# Patient Record
Sex: Male | Born: 2005 | Race: Black or African American | Hispanic: No | Marital: Single | Smoking: Never smoker
Health system: Southern US, Community
[De-identification: ages and names within clinical notes are randomized; demographics above are authoritative.]

## PROBLEM LIST (undated history)

## (undated) HISTORY — PX: OTHER SURGICAL HISTORY: SHX169

---
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
## 2024-07-04 ENCOUNTER — Other Ambulatory Visit: Payer: Self-pay

## 2024-07-04 ENCOUNTER — Emergency Department
Admission: EM | Admit: 2024-07-04 | Discharge: 2024-07-04 | Disposition: A | Discharge status: Home / Self Care | Attending: Emergency Medicine | Admitting: Emergency Medicine

## 2024-07-04 DIAGNOSIS — M79675 Pain in left toe(s): Secondary | ICD-10-CM | POA: Insufficient documentation

## 2024-07-04 NOTE — ED Provider Notes (Signed)
 [facility] Provider Note    Event Date/Time   First MD Initiated Contact with Patient 07/04/24 1200     (approximate)   History   Toe Injury   HPI  Maxwell Simmons is a 18 y.o. male  with no pertinent past medical history presents to the emergency department with left pinky toe pain. Patient states this pain has been on and off for over a year specifically after he scratches his toe. Reports it hurts intermittently.  Has not tried any home remedies.  Denies fever, chills, pus-like discharge, excessive warmth, erythema, fall or injury, foot or ankle pain. Unspecified allergy to ibuprofen. Has not had a recent pedicure.   Physical Exam   Triage Vital Signs: ED Triage Vitals  Encounter Vitals Group     BP 07/04/24 1148 132/82     Girls Systolic BP Percentile --      Girls Diastolic BP Percentile --      Boys Systolic BP Percentile --      Boys Diastolic BP Percentile --      Pulse Rate 07/04/24 1148 84     Resp 07/04/24 1148 18     Temp 07/04/24 1148 98 F (36.7 C)     Temp src --      SpO2 07/04/24 1148 96 %     Weight --      Height --      Head Circumference --      Peak Flow --      Pain Score 07/04/24 1146 4     Pain Loc --      Pain Education --      Exclude from Growth Chart --     Most recent vital signs: Vitals:   07/04/24 1148  BP: 132/82  Pulse: 84  Resp: 18  Temp: 98 F (36.7 C)  SpO2: 96%    General: Awake, in no acute distress. Appears stated age. CV: Good peripheral perfusion. DP pulses 2+ b/l. Cap refill <2 sec. Respiratory:Normal respiratory effort.  No respiratory distress.  GI: Soft, non-distended. MSK: Normal ROM and 5/5 strength in b/l toes and ankles. Skin:Warm, dry, intact. Image below--appears part of his lateral 5th left toenail is missing with some overgrown skin. No fluctuant area or erythema, excessive warmth, ecchymoses.    ED Results / Procedures / Treatments   Labs (all labs ordered are listed,  but only abnormal results are displayed) Labs Reviewed - No data to display   EKG     RADIOLOGY    PROCEDURES:  Critical Care performed: No   Procedures   MEDICATIONS ORDERED IN ED: Medications - No data to display   IMPRESSION / MDM / ASSESSMENT AND PLAN / ED COURSE  I reviewed the triage vital signs and the nursing notes.                              Differential diagnosis includes, but is not limited to, nail skin overgrowth, nail cuticle pain, paronychia, felon, onchymycosis  Patient's presentation is most consistent with acute, uncomplicated illness.  Patient is a 18 year old male with signs and symptoms as described above.  Pain is acute on chronic according to patient's history.  He did not have any fall or injury and is able to walk on his foot with ease so I do not think that x-ray was warranted at this time.  He does not have any clinical signs or  symptoms of a paronychia or felon or onychomycosis.  He is afebrile, well-appearing.  I believe the skin overgrowth on his right toe may be pressing on his nail causing him pain or he may have pain from pulling at a cuticle when scratching his nail. He can use Tylenol  at home based on instructions on the bottle.  I gave him a postop shoe and discussed with him that he will need to see podiatry for possible further workup and skin or toenail removal.   The patient may return to the emergency department for any new, worsening, or concerning symptoms. Patient was given the opportunity to ask questions; all questions were answered. Emergency department return precautions were discussed with the patient.  Patient is in agreement to the treatment plan.  Patient is stable for discharge.    FINAL CLINICAL IMPRESSION(S) / ED DIAGNOSES   Final diagnoses:  Pain in left toe(s)     Rx / DC Orders   ED Discharge Orders          Ordered    Ambulatory Referral to Primary Care (Establish Care)        07/04/24 1248              Note:  This document was prepared using Dragon voice recognition software and may include unintentional dictation errors.     Sheron Salm, PA-C 07/04/24 1428    Levander Slate, MD 07/04/24 [phone]

## 2024-07-04 NOTE — Discharge Instructions (Addendum)
 You were seen in the emergency department for pain in your left pinky toe.  Please follow-up with the podiatrist listed.  Wear the postop shoe for your comfort.  You may use Tylenol  as needed for pain based on the instructions on the bottle; you can get these over the counter at any drug store. A pedicure may also help. Apply ice to any area that hurts. I have also given you a list of resources to establish care with primary care.  Please give one of their offices a call at your earliest convenience.

## 2024-07-04 NOTE — ED Triage Notes (Signed)
 Pt comes with c/o left pinkie toe nail split. Pt states this happened over year ago. Pt states he scratched it again recently and it has been hurting.

## 2024-07-07 ENCOUNTER — Encounter: Payer: Self-pay | Admitting: Podiatry

## 2024-07-07 ENCOUNTER — Other Ambulatory Visit: Payer: Self-pay | Admitting: Lab

## 2024-07-07 ENCOUNTER — Ambulatory Visit (INDEPENDENT_AMBULATORY_CARE_PROVIDER_SITE_OTHER): Payer: Self-pay | Admitting: Podiatry

## 2024-07-07 ENCOUNTER — Other Ambulatory Visit: Payer: Self-pay

## 2024-07-07 DIAGNOSIS — L600 Ingrowing nail: Secondary | ICD-10-CM | POA: Diagnosis not present

## 2024-07-07 MED ORDER — NEOMYCIN-POLYMYXIN-HC 3.5-10000-1 OT SUSP: OTIC | 0 refills | Status: DC

## 2024-07-07 MED ORDER — NEOMYCIN-POLYMYXIN-HC 3.5-10000-1 OT SUSP: OTIC | 0 refills | Status: AC

## 2024-07-07 NOTE — Progress Notes (Signed)
  Subjective:  Patient ID: Maxwell Simmons, male    DOB: 03/14/2006,  MRN: [id number]  Chief Complaint  Patient presents with   Nail Problem    RM 3 Patient is here for left 5th toe nail pain. Pt states pain has been present for several weeks.    18 y.o. male presents with the above complaint. History confirmed with patient.   Objective:  Physical Exam: warm, good capillary refill, no trophic changes or ulcerative lesions, normal DP and PT pulses, normal sensory exam, and ingrown fifth toenail left with lateral border listers corn. Assessment:   1. Ingrown nail      Plan:  Patient was evaluated and treated and all questions answered.    Ingrown Nail, left -Patient elects to proceed with minor surgery to remove ingrown toenail today. Consent reviewed and signed by patient. -Ingrown nail excised. See procedure note. -Educated on post-procedure care including soaking. Written instructions provided and reviewed. -Rx for Cortisporin sent to pharmacy. -Advised on signs and symptoms of infection developing.  We discussed that the phenol likely will create some redness and edema and tenderness around the nailbed as long as it is localized this is to be expected.  Will return as needed if any infection signs develop  Procedure: Excision of Ingrown Toenail Location: Left 1st toe lateral nail borders. Anesthesia: Lidocaine  1% plain; 1.5 mL and Marcaine 0.5% plain; 1.5 mL, digital block. Skin Prep: Betadine. Dressing: Silvadene; telfa; dry, sterile, compression dressing. Technique: Following skin prep, the toe was exsanguinated and a tourniquet was secured at the base of the toe. The affected nail border was freed, split with a nail splitter, and excised. Chemical matrixectomy was then performed with phenol and irrigated out with alcohol. The tourniquet was then removed and sterile dressing applied. Disposition: Patient tolerated procedure well.    Return if symptoms worsen or fail to  improve.

## 2024-07-07 NOTE — Patient Instructions (Signed)

## 2024-08-21 ENCOUNTER — Emergency Department
Admission: EM | Admit: 2024-08-21 | Discharge: 2024-08-21 | Disposition: A | Discharge status: Home / Self Care | Payer: Self-pay | Attending: Emergency Medicine | Admitting: Emergency Medicine

## 2024-08-21 ENCOUNTER — Encounter: Payer: Self-pay | Admitting: Emergency Medicine

## 2024-08-21 ENCOUNTER — Other Ambulatory Visit: Payer: Self-pay

## 2024-08-21 DIAGNOSIS — L03032 Cellulitis of left toe: Secondary | ICD-10-CM | POA: Insufficient documentation

## 2024-08-21 MED ORDER — CEPHALEXIN 500 MG PO CAPS: 500.0000 mg | ORAL_CAPSULE | Freq: Four times a day (QID) | ORAL | 0 refills | Status: AC

## 2024-08-21 NOTE — ED Provider Notes (Signed)
 [facility] Provider Note    Event Date/Time   First MD Initiated Contact with Patient 08/21/24 1052     (approximate)   History   Toe Pain   HPI  Maxwell Simmons is a 18 y.o. male who presents today for evaluation of left toe pain.  Patient reports that he had his ingrown toenail removed by Dr. Silva in October and had been doing well until about 1 month ago he began to develop pain again.  He wears steel toed boots for work and feels that this is rubbing against the area causing him more discomfort.  He reports that he has a follow-up appointment on Tuesday with the podiatrist.  There are no active problems to display for this patient.         Physical Exam   Triage Vital Signs: ED Triage Vitals  Encounter Vitals Group     BP 08/21/24 1031 130/87     Girls Systolic BP Percentile --      Girls Diastolic BP Percentile --      Boys Systolic BP Percentile --      Boys Diastolic BP Percentile --      Pulse Rate 08/21/24 1031 75     Resp 08/21/24 1031 18     Temp 08/21/24 1031 98.4 F (36.9 C)     Temp Source 08/21/24 1031 Oral     SpO2 08/21/24 1031 98 %     Weight 08/21/24 1051 165 lb 5.5 oz (75 kg)     Height 08/21/24 1051 5' 8 (1.727 m)     Head Circumference --      Peak Flow --      Pain Score 08/21/24 1031 6     Pain Loc --      Pain Education --      Exclude from Growth Chart --     Most recent vital signs: Vitals:   08/21/24 1031  BP: 130/87  Pulse: 75  Resp: 18  Temp: 98.4 F (36.9 C)  SpO2: 98%    Physical Exam Vitals and nursing note reviewed.  Constitutional:      General: Awake and alert. No acute distress.    Appearance: Normal appearance. The patient is normal weight.  HENT:     Head: Normocephalic and atraumatic.     Mouth: Mucous membranes are moist.  Eyes:     General: PERRL. Normal EOMs        Right eye: No discharge.        Left eye: No discharge.     Conjunctiva/sclera: Conjunctivae normal.   Cardiovascular:     Rate and Rhythm: Normal rate and regular rhythm.     Pulses: Normal pulses.  Pulmonary:     Effort: Pulmonary effort is normal. No respiratory distress.     Breath sounds: Normal breath sounds.  Abdominal:     Abdomen is soft. There is no abdominal tenderness. No rebound or guarding. No distention. Musculoskeletal:        General: No swelling. Normal range of motion.     Cervical back: Normal range of motion and neck supple.  Skin:    General: Skin is warm and dry.     Capillary Refill: Capillary refill takes less than 2 seconds.     Findings: No rash.  Neurological:     Mental Status: The patient is awake and alert.      ED Results / Procedures / Treatments   Labs (all labs  ordered are listed, but only abnormal results are displayed) Labs Reviewed - No data to display   EKG     RADIOLOGY     PROCEDURES:  Critical Care performed:   Procedures   MEDICATIONS ORDERED IN ED: Medications - No data to display   IMPRESSION / MDM / ASSESSMENT AND PLAN / ED COURSE  I reviewed the triage vital signs and the nursing notes.   Differential diagnosis includes, but is not limited to, cellulitis, paronychia, felon.  I reviewed the patient's chart.  Patient was seen on 07/04/2024 at which time he was instructed to follow-up with podiatry.  He subsequently saw podiatry 3 days later and had his ingrown toenail excised.  Patient has mild erythema to the left fifth toe, though no fluctuance along the cuticle to suggest paronychia.  Offered incision and drainage to see if any purulence, however patient reports that he has an appointment on Tuesday and prefers to wait until then.  There is no erythema or swelling to the foot itself.  Will start him on antibiotics at this time.  No plantar abnormalities.  We discussed return precautions and the importance of outpatient follow-up as planned on Tuesday.  He requested a work note which was provided.  We discussed  return precautions.  Patient understands and agrees with plan.  Discharged in stable condition.  Patient's presentation is most consistent with acute complicated illness / injury requiring diagnostic workup.    FINAL CLINICAL IMPRESSION(S) / ED DIAGNOSES   Final diagnoses:  Cellulitis of toe of left foot     Rx / DC Orders   ED Discharge Orders          Ordered    cephALEXin  (KEFLEX ) 500 MG capsule  4 times daily        08/21/24 1107             Note:  This document was prepared using Dragon voice recognition software and may include unintentional dictation errors.   Tedrick Port E, PA-C 08/21/24 1419    Willo Dunnings, MD 08/21/24 1426

## 2024-08-21 NOTE — Discharge Instructions (Signed)
 You did not wish to  have an incision and drainage today. Please take the antibiotics as prescribed.  Follow-up with your podiatrist as you have scheduled on Tuesday.  Please return for any new, worsening, or change in symptoms or other concerns.

## 2024-08-21 NOTE — ED Triage Notes (Signed)
 Patient arrives ambulatory by POV states he had an ingrown toenail on left pinky toe removed in October. States in November it started to swell and is painful. States he wears steel toe boots for work and has made it worse.

## 2024-08-21 NOTE — ED Notes (Signed)
 Having pain to left 5 toe  Denies any recent injury   but did have a toenail removed in Oct   Afebrile on arrival

## 2024-08-24 ENCOUNTER — Ambulatory Visit: Admitting: Podiatry

## 2024-08-28 ENCOUNTER — Emergency Department
Admission: EM | Admit: 2024-08-28 | Discharge: 2024-08-28 | Disposition: A | Discharge status: Home / Self Care | Payer: Self-pay | Attending: Emergency Medicine | Admitting: Emergency Medicine

## 2024-08-28 ENCOUNTER — Other Ambulatory Visit: Payer: Self-pay

## 2024-08-28 DIAGNOSIS — L600 Ingrowing nail: Secondary | ICD-10-CM | POA: Insufficient documentation

## 2024-08-28 MED ORDER — SULFAMETHOXAZOLE-TRIMETHOPRIM 800-160 MG PO TABS: 1.0000 | ORAL_TABLET | Freq: Two times a day (BID) | ORAL | 0 refills | Status: AC

## 2024-08-28 MED ORDER — ACETAMINOPHEN 500 MG PO TABS
1000.0000 mg | ORAL_TABLET | Freq: Once | ORAL | Status: AC
  Administered 2024-08-28: 1000 mg via ORAL
  Filled 2024-08-28: qty 2

## 2024-08-28 NOTE — ED Triage Notes (Signed)
 Pt to ED with mother for toe problem (L pinkie toe). Saw podiatrist for ingrown toenail and had whole toenail removed. Was seen here after that and was treated for infection. Was unable to see podiatrist last week because of change in insurance. Is here because of continued pain and concern for infection and needs doctor's note because hard to bear weight on it. Toe is swollen and slightly darker in color compared to others.

## 2024-08-28 NOTE — ED Provider Notes (Signed)
 [facility] Emergency Department Provider Note     Event Date/Time   First MD Initiated Contact with Patient 08/28/24 1420     (approximate)   History   Toe Pain   HPI  Maxwell Simmons is a 18 y.o. male presents to the ED for concerns of possible infection to left toe following a ingrown toenail removal procedure.  He was placed on Keflex .  He has 4 pills left.  Patient noted pus and drainage 2 days ago from the site.  He reports pain with bearing weight.  He is currently in a postop shoe to help with ambulation.  He is requesting a extended doctor's note as he is having changes with his Medicaid and unable to follow-up with podiatry until Tuesday 12/16.  He denies any fever or chills.     Physical Exam   Triage Vital Signs: ED Triage Vitals  Encounter Vitals Group     BP 08/28/24 1323 121/69     Girls Systolic BP Percentile --      Girls Diastolic BP Percentile --      Boys Systolic BP Percentile --      Boys Diastolic BP Percentile --      Pulse Rate 08/28/24 1323 76     Resp 08/28/24 1323 20     Temp 08/28/24 1323 98.2 F (36.8 C)     Temp Source 08/28/24 1323 Oral     SpO2 08/28/24 1323 100 %     Weight 08/28/24 1322 165 lb 5.5 oz (75 kg)     Height 08/28/24 1322 5' 8 (1.727 m)     Head Circumference --      Peak Flow --      Pain Score 08/28/24 1320 7     Pain Loc --      Pain Education --      Exclude from Growth Chart --     Most recent vital signs: Vitals:   08/28/24 1323 08/28/24 1350  BP: 121/69   Pulse: 76   Resp: 20   Temp: 98.2 F (36.8 C)   SpO2: 100% 100%    General Awake, no distress.  HEENT NCAT.  CV:  Good peripheral perfusion.  RESP:  Normal effort.  ABD:  No distention.  Other: Tenderness to palpation.  Mild swelling noted.  No drainage or pus noted on my examination.  At the distal portion superior to distal nail there does appear to be a pocket of pus versus dry skin.  Please see media  below.      ED Results / Procedures / Treatments   Labs (all labs ordered are listed, but only abnormal results are displayed) Labs Reviewed - No data to display  No results found.  PROCEDURES:  Critical Care performed: No  Procedures  MEDICATIONS ORDERED IN ED: Medications  acetaminophen  (TYLENOL ) tablet 1,000 mg (has no administration in time range)   IMPRESSION / MDM / ASSESSMENT AND PLAN / ED COURSE  I reviewed the triage vital signs and the nursing notes.                                 18 y.o. male presents to the emergency department for evaluation and treatment of ingrown toenail. See HPI for further details.   Differential diagnosis includes, but is not limited to ingrown toenail, paronychia, felon, cellulitis  Patient's presentation is most consistent with acute, uncomplicated  illness.  Patient is alert and oriented.  He is hemodynamic stable and afebrile.  Physical exam findings including media is as stated above.  Concerns for paronychia on distal portion of toe.  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  Given that he does have a close follow-up with his podiatry on Tuesday this is reasonable and can be done outpatient.  I will continue antibiotics place him on Bactrim  in the interim.  Advised ice and Tylenol  for pain control given his allergy to NSAIDs.  He verbalized understanding is agreement with this care plan.  Work note extended until his scheduled follow-up appointment on Tuesday.  Patient stable condition for discharge home.  ED return precaution discussed.  FINAL CLINICAL IMPRESSION(S) / ED DIAGNOSES   Final diagnoses:  Ingrown toenail of left foot     Rx / DC Orders   ED Discharge Orders          Ordered    sulfamethoxazole -trimethoprim  (BACTRIM  DS) 800-160 MG tablet  2 times daily        08/28/24 1457             Note:  This document was  prepared using Dragon voice recognition software and may include unintentional dictation errors.    Margrette, Daley Mooradian A, PA-C 08/28/24 1734    Jacolyn Pae, MD 08/28/24 1956

## 2024-08-28 NOTE — Discharge Instructions (Addendum)
 Continue to go to your scheduled appointment on December 16th for further evaluation with the podiatrist.  Pain control:  Ibuprofen (motrin/aleve/advil) - You can take 3 tablets (600 mg) every 6 hours as needed for pain/fever.  Acetaminophen  (tylenol ) - You can take 2 extra strength tablets (1000 mg) every 6 hours as needed for pain/fever.  You can alternate these medications or take them together.  Make sure you eat food/drink water when taking these medications.

## 2024-08-30 ENCOUNTER — Ambulatory Visit: Admitting: Podiatry

## 2024-09-02 ENCOUNTER — Ambulatory Visit (INDEPENDENT_AMBULATORY_CARE_PROVIDER_SITE_OTHER): Payer: Self-pay | Admitting: Podiatry

## 2024-09-02 ENCOUNTER — Encounter: Payer: Self-pay | Admitting: Podiatry

## 2024-09-02 DIAGNOSIS — L600 Ingrowing nail: Secondary | ICD-10-CM

## 2024-09-02 NOTE — Progress Notes (Signed)
°  Subjective:  Patient ID: Maxwell Simmons, male    DOB: 06-Mar-2006,  MRN: [id number]  Chief Complaint  Patient presents with   Toe Pain    Left foot 5th toe     18 y.o. male presents with the above complaint.  Patient presents with complaint of skin epidermolysis to the fifth digit he had a history of ingrown removal by Dr. Tennis just wanted to get it evaluated.  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  Has been bothering.  Denies any other acute complaints patient was already given antibiotics and is almost done completing his course.   Review of Systems: Negative except as noted in the HPI. Denies N/V/F/Ch.  No past medical history on file. Current Medications[1]  Tobacco Use History[2]  Allergies[3] Objective:  There were no vitals filed for this visit. There is no height or weight on file to calculate BMI. Constitutional Well developed. Well nourished.  Vascular Dorsalis pedis pulses palpable bilaterally. Posterior tibial pulses palpable bilaterally. Capillary refill normal to all digits.  No cyanosis or clubbing noted. Pedal hair growth normal.  Neurologic Normal speech. Oriented to person, place, and time. Epicritic sensation to light touch grossly present bilaterally.  Dermatologic Left fifth digit skin epidermolysi noted.  No signs of infection noted.  No purulent drainage noted skin is healing well.  Orthopedic: Normal joint ROM without pain or crepitus bilaterally. No visible deformities. No bony tenderness.   Radiographs: None Assessment:   1. Ingrown nail    Plan:  Patient was evaluated and treated and all questions answered.  Left fifth digit skin epidermolysis with a history of ingrown removal - All questions or concerns were discussed with the patient at this time clinically it is healing well.  Mild chemical reaction may have happened due to phenol.  However the skin and the toe is healing well.  No acute concern for infection. - She  care modification extensively discussed.  No follow-ups on file.    [1]  Current Outpatient Medications:    brompheniramine-pseudoephedrine-DM 30-2-10 MG/5ML syrup, Take 5 mLs by mouth 4 (four) times daily as needed. (Patient not taking: Reported on 07/07/2024), Disp: 120 mL, Rfl: 0   cephALEXin  (KEFLEX ) 500 MG capsule, Take 1 capsule (500 mg total) by mouth 3 (three) times daily. (Patient not taking: Reported on 07/07/2024), Disp: 21 capsule, Rfl: 0   neomycin -polymyxin-hydrocortisone (CORTISPORIN) 3.5-10000-1 OTIC suspension, Apply 1-2 drops daily after soaking and cover with bandaid, Disp: 10 mL, Rfl: 0   sulfamethoxazole -trimethoprim  (BACTRIM  DS) 800-160 MG tablet, Take 1 tablet by mouth 2 (two) times daily., Disp: 14 tablet, Rfl: 0 [2]  Social History Tobacco Use  Smoking Status Never  Smokeless Tobacco Never  [3]  Allergies Allergen Reactions   Motrin [Ibuprofen]

## 2024-09-16 ENCOUNTER — Emergency Department
Admission: EM | Admit: 2024-09-16 | Discharge: 2024-09-16 | Discharge status: Against Medical Advice | Payer: Self-pay | Attending: Emergency Medicine | Admitting: Emergency Medicine

## 2024-09-16 ENCOUNTER — Other Ambulatory Visit: Payer: Self-pay

## 2024-09-16 DIAGNOSIS — Z5321 Procedure and treatment not carried out due to patient leaving prior to being seen by health care provider: Secondary | ICD-10-CM | POA: Insufficient documentation

## 2024-09-16 DIAGNOSIS — R040 Epistaxis: Secondary | ICD-10-CM | POA: Insufficient documentation

## 2024-09-16 NOTE — ED Notes (Signed)
 See triage note  Presents with intermittent nosebleeds for the past weeks  Denies any trauma  No active bleeding on arrival to room

## 2024-09-16 NOTE — ED Triage Notes (Signed)
 Pt to ED via POV from home. Pt reports intermittent nosebleed x6 days. No bleeding on arrival. No injuries.

## 2024-09-19 ENCOUNTER — Other Ambulatory Visit: Payer: Self-pay

## 2024-09-19 ENCOUNTER — Emergency Department
Admission: EM | Admit: 2024-09-19 | Discharge: 2024-09-19 | Disposition: A | Discharge status: Home / Self Care | Payer: Self-pay | Source: Home / Self Care

## 2024-09-19 DIAGNOSIS — R040 Epistaxis: Secondary | ICD-10-CM | POA: Insufficient documentation

## 2024-09-19 DIAGNOSIS — Z5321 Procedure and treatment not carried out due to patient leaving prior to being seen by health care provider: Secondary | ICD-10-CM | POA: Insufficient documentation

## 2024-09-19 NOTE — ED Triage Notes (Signed)
 Pt comes iwht nose bleed for the past 6-7 days. Pt states no hx of this. Pt currently is not having any bleeding.

## 2024-09-21 ENCOUNTER — Emergency Department
Admission: EM | Admit: 2024-09-21 | Discharge: 2024-09-21 | Disposition: A | Discharge status: Home / Self Care | Payer: Self-pay | Attending: Emergency Medicine | Admitting: Emergency Medicine

## 2024-09-21 ENCOUNTER — Other Ambulatory Visit: Payer: Self-pay

## 2024-09-21 ENCOUNTER — Encounter: Payer: Self-pay | Admitting: Emergency Medicine

## 2024-09-21 DIAGNOSIS — R040 Epistaxis: Secondary | ICD-10-CM | POA: Insufficient documentation

## 2024-09-21 NOTE — ED Provider Notes (Signed)
 "  [facility] Provider Note    Event Date/Time   First MD Initiated Contact with Patient 09/21/24 1103     (approximate)   History   Epistaxis   HPI  Maxwell Simmons is a 19 y.o. male   who presents to the emergency department today because of concerns for nosebleed.  The patient has been having nosebleeds for about 2 weeks.  He says it comes from his left naris.  Patient denies any trauma to his nose.  He denies any digital trauma.  Denies any drug use.  Denies history of similar in the past.      Physical Exam   Triage Vital Signs: ED Triage Vitals [09/21/24 1053]  Encounter Vitals Group     BP 123/73     Girls Systolic BP Percentile      Girls Diastolic BP Percentile      Boys Systolic BP Percentile      Boys Diastolic BP Percentile      Pulse Rate 75     Resp 16     Temp 98 F (36.7 C)     Temp Source Oral     SpO2 100 %     Weight 165 lb 5.5 oz (75 kg)     Height 5' 8 (1.727 m)     Head Circumference      Peak Flow      Pain Score 0     Pain Loc      Pain Education      Exclude from Growth Chart     Most recent vital signs: Vitals:   09/21/24 1053  BP: 123/73  Pulse: 75  Resp: 16  Temp: 98 F (36.7 C)  SpO2: 100%   General: Awake, alert, oriented. CV:  Good peripheral perfusion.  Resp:  Normal effort.  Abd:  No distention.  Other:  No active bleeding from either nares. No obvious source of bleeding seen in anterior nares.   ED Results / Procedures / Treatments   Labs (all labs ordered are listed, but only abnormal results are displayed) Labs Reviewed - No data to display   EKG  None   RADIOLOGY None   PROCEDURES:  Critical Care performed: No    MEDICATIONS ORDERED IN ED: Medications - No data to display   IMPRESSION / MDM / ASSESSMENT AND PLAN / ED COURSE  I reviewed the triage vital signs and the nursing notes.                              Differential diagnosis includes, but is not limited  to, AVM, neoplasm, dry air  Patient's presentation is most consistent with acute presentation with potential threat to life or bodily function.   Patient presents to the emergency department today because concerns for nosebleeds have been occurring on and off for the past 2 weeks.  At the time my exam patient is not having any active bleeding.  No obvious source of bleeding seen in anterior naris.  I did discuss with patient possibility of dry ear causing the bleeding.  Did encourage patient to use humidifier and petroleum jelly.  Will give patient ENT follow-up.   FINAL CLINICAL IMPRESSION(S) / ED DIAGNOSES   Final diagnoses:  Epistaxis     Note:  This document was prepared using Dragon voice recognition software and may include unintentional dictation errors.    Floy Roberts, MD 09/21/24 1346  "

## 2024-09-21 NOTE — Discharge Instructions (Signed)
 As we discussed you can use a petroleum jelly to try to moisturize your nostrils as well as a dehumidifier.

## 2024-09-21 NOTE — ED Triage Notes (Signed)
 Pt reports 2 weeks of constant nose bleeds daily. They are happening after coughing or getting out of showering.
